# Patient Record
Sex: Male | Born: 2000
Health system: Southern US, Community
[De-identification: ages and names within clinical notes are randomized; demographics above are authoritative.]

## PROBLEM LIST (undated history)

## (undated) DIAGNOSIS — F909 Attention-deficit hyperactivity disorder, unspecified type: Secondary | ICD-10-CM

## (undated) HISTORY — PX: CARDIAC SURGERY: SHX584

## (undated) HISTORY — DX: Attention-deficit hyperactivity disorder, unspecified type: F90.9

---
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
## 2021-05-05 DIAGNOSIS — S6291XA Unspecified fracture of right wrist and hand, initial encounter for closed fracture: Secondary | ICD-10-CM | POA: Diagnosis not present

## 2021-05-05 DIAGNOSIS — M79641 Pain in right hand: Secondary | ICD-10-CM | POA: Diagnosis not present

## 2021-05-06 DIAGNOSIS — S62356D Nondisplaced fracture of shaft of fifth metacarpal bone, right hand, subsequent encounter for fracture with routine healing: Secondary | ICD-10-CM | POA: Diagnosis not present

## 2021-05-26 DIAGNOSIS — S62356D Nondisplaced fracture of shaft of fifth metacarpal bone, right hand, subsequent encounter for fracture with routine healing: Secondary | ICD-10-CM | POA: Diagnosis not present

## 2022-03-07 IMAGING — CT CT ABD-PELV W/ CM
2 of 4 series · 15 of 46 positions shown, 17 images · IV contrast (APPLIED)
Comparison: None.

CLINICAL DATA: Right lower quadrant abdominal pain.

EXAM:
CT ABDOMEN AND PELVIS WITH CONTRAST
TECHNIQUE: Multidetector CT imaging of the abdomen and pelvis was performed
using the standard protocol following bolus administration of
intravenous contrast.
CONTRAST:  100mL OMNIPAQUE IOHEXOL 300 MG/ML  SOLN

[Series 2: routine abd/pel with · axial · 0.71mm/px · z∈[-1007,-607]mm · 12 of 92 slices shown, 14 images]
[im 8/92  soft-tissue]
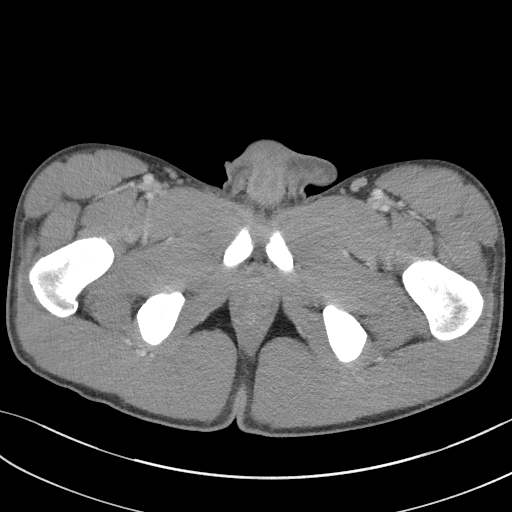
[im 8/92  bone]
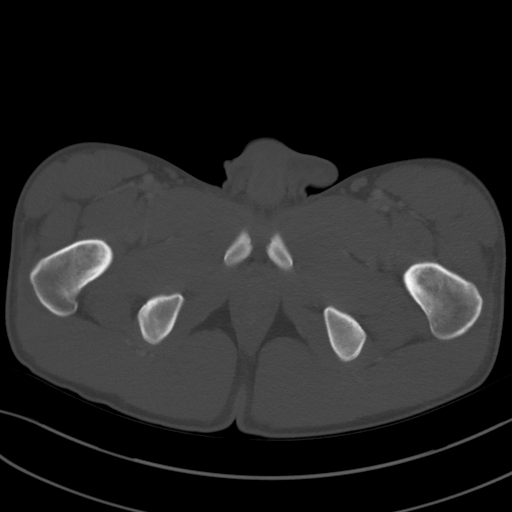
[im 15/92  soft-tissue]
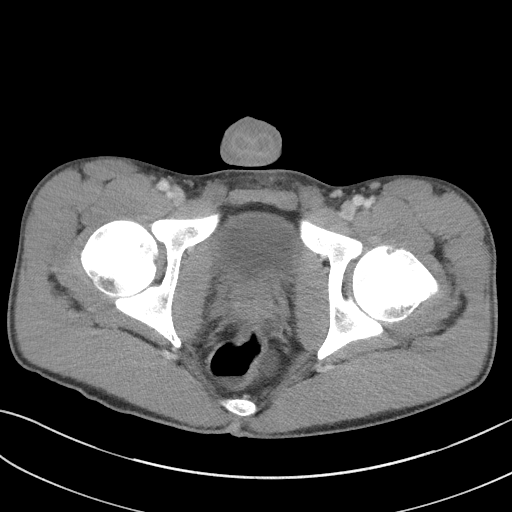
[im 22/92  soft-tissue]
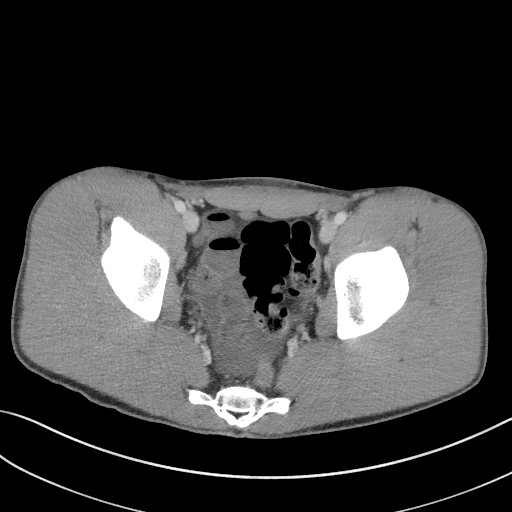
[im 30/92  soft-tissue]
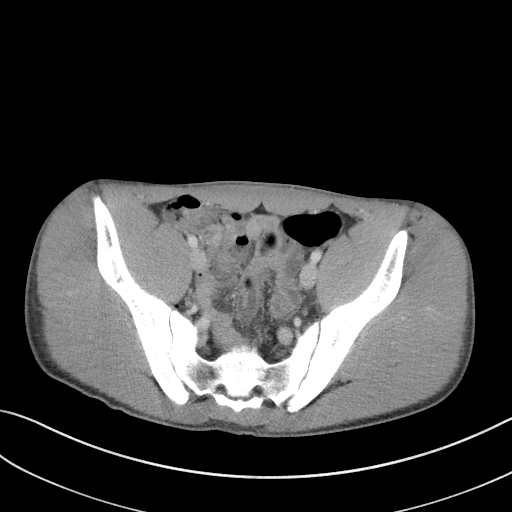
[im 37/92  soft-tissue]
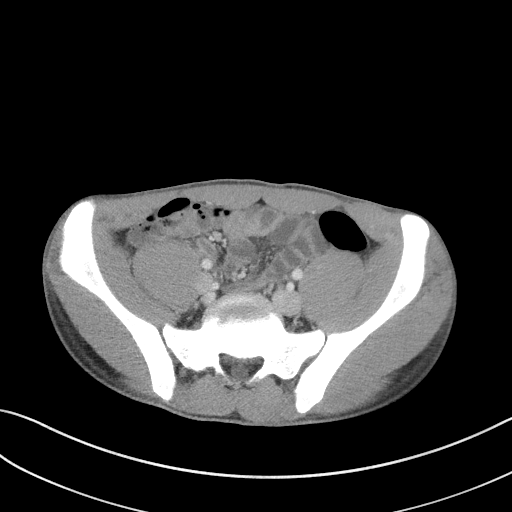
[im 44/92  soft-tissue]
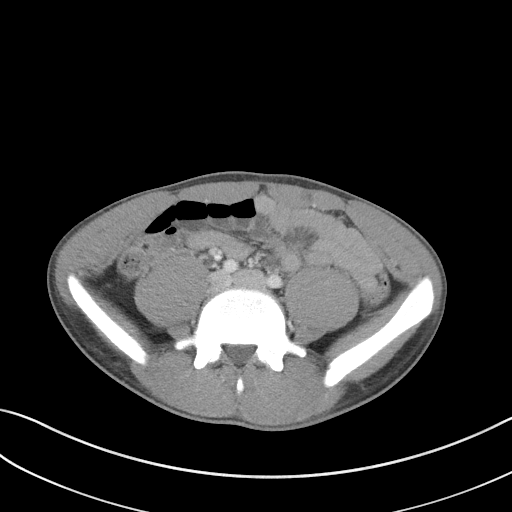
[im 51/92  soft-tissue]
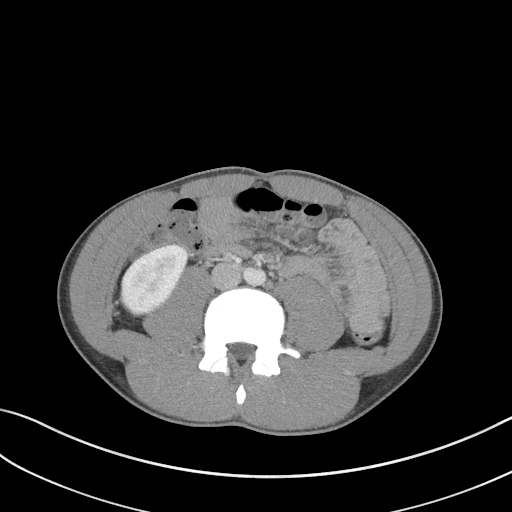
[im 59/92  soft-tissue]
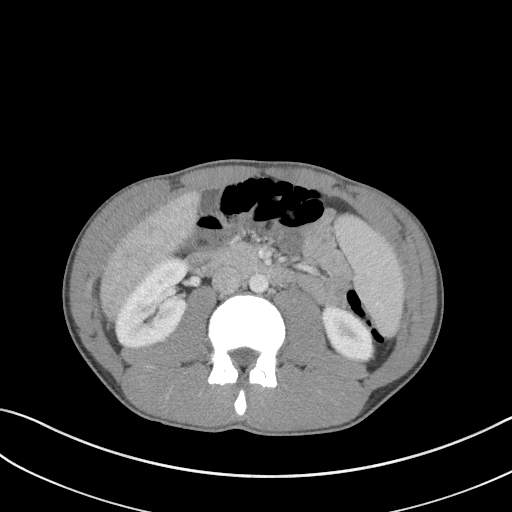
[im 66/92  soft-tissue]
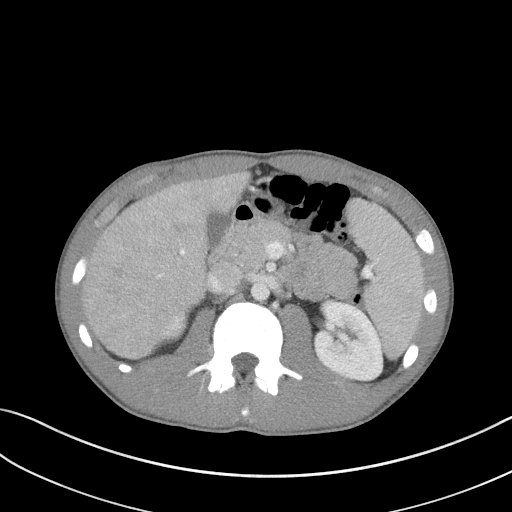
[im 66/92  bone]
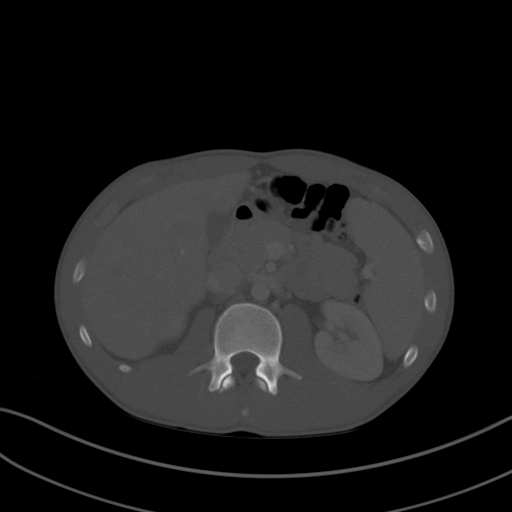
[im 73/92  soft-tissue]
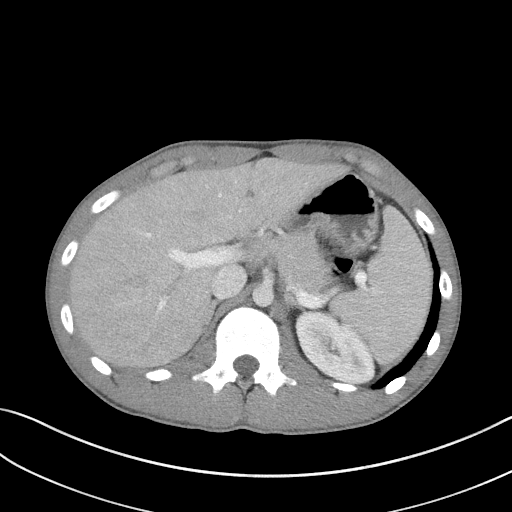
[im 81/92  soft-tissue]
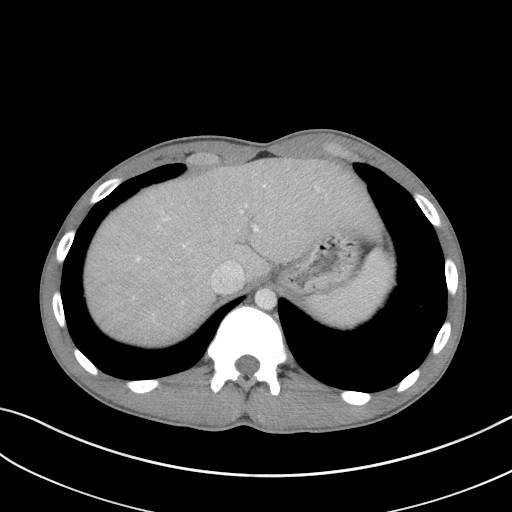
[im 88/92  soft-tissue]
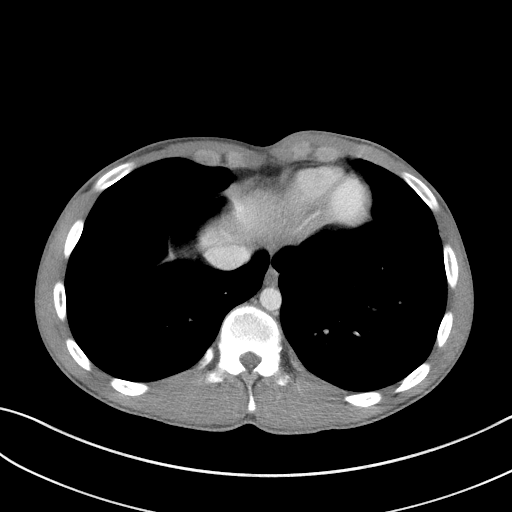

[Series 5: coronal st · coronal · 0.63mm/px · 3 of 71 slices shown]
[im 24/71  soft-tissue]
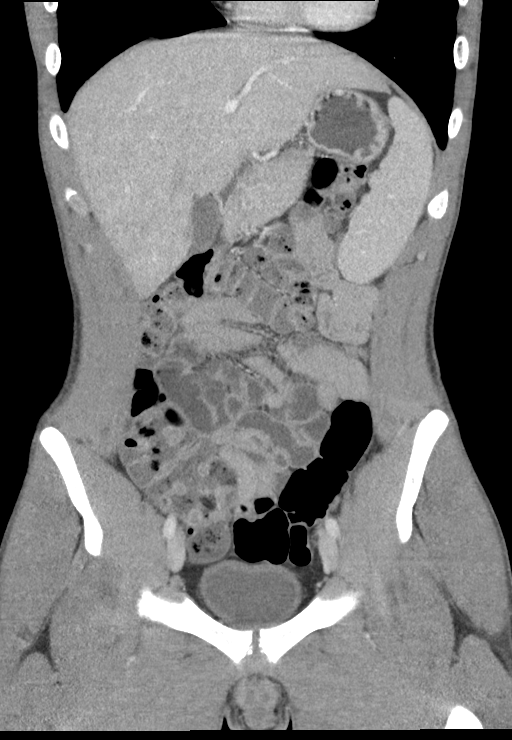
[im 32/71  soft-tissue]
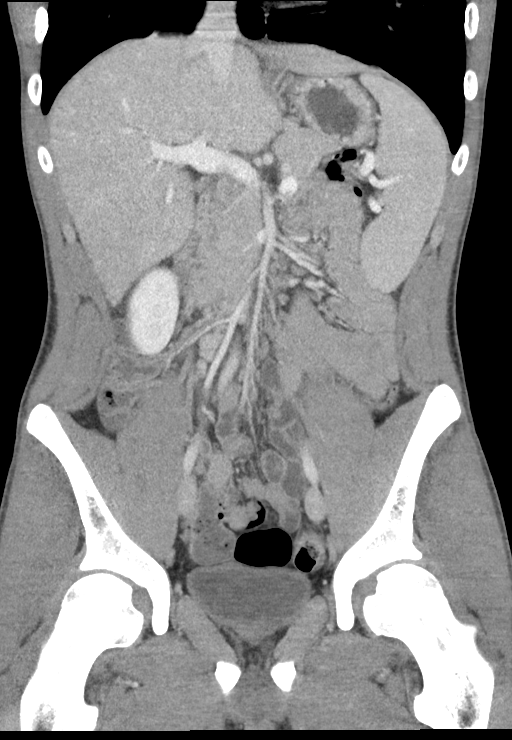
[im 39/71  soft-tissue]
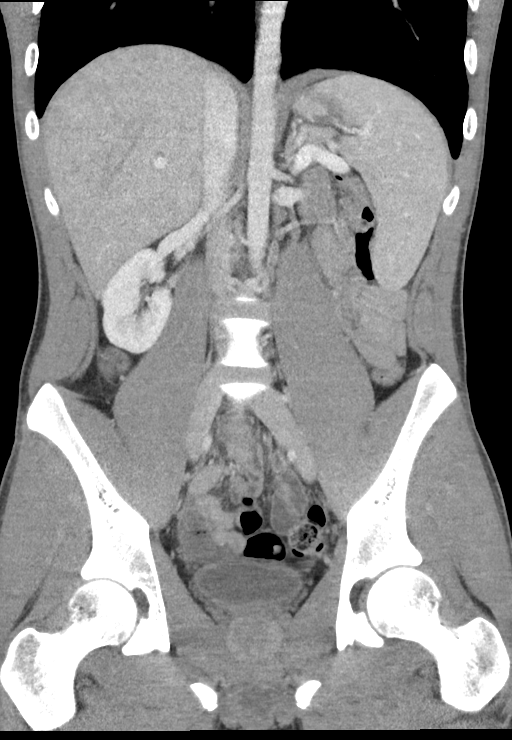

[15 of 46 positions shown; findings below may reference images not displayed]

FINDINGS: Lower chest: The lung bases are clear. The heart size is normal.

Hepatobiliary: The liver is normal. Normal gallbladder.There is no
biliary ductal dilation.

Pancreas: Normal contours without ductal dilatation. No
peripancreatic fluid collection.

Spleen: The spleen is enlarged measuring nearly 14 cm craniocaudad.

Adrenals/Urinary Tract:

--Adrenal glands: Unremarkable.

--Right kidney/ureter: No hydronephrosis or radiopaque kidney
stones.

--Left kidney/ureter: No hydronephrosis or radiopaque kidney stones.

--Urinary bladder: There is mild bladder wall thickening.

Stomach/Bowel:

--Stomach/Duodenum: No hiatal hernia or other gastric abnormality.
Normal duodenal course and caliber.

--Small bowel: Unremarkable.

--Colon: Unremarkable.

--Appendix: The appendix is mildly dilated measuring up to
approximately 8-9 mm in diameter. There is mild hyperenhancement of
the appendix. There is no appendicloith.

Vascular/Lymphatic: Normal course and caliber of the major abdominal
vessels.

--No retroperitoneal lymphadenopathy.

--there is some mild adenopathy in the right lower quadrant,
presumably reactive.

--No pelvic or inguinal lymphadenopathy.

Reproductive: Unremarkable

Other: There is a moderate amount of free fluid in the patient's
pelvis. There is no free air. The abdominal wall is normal.

Musculoskeletal. No acute displaced fractures.
IMPRESSION: 1. Mildly dilated appendix with mild hyperenhancement, concerning
for very early acute uncomplicated appendicitis in the appropriate
clinical setting. Correlation with laboratory studies and physical
exam is recommended.
2. Moderate amount of free fluid in the patient's pelvis.
3. Splenomegaly.
4. Mild bladder wall thickening. Correlate with urinalysis.

## 2022-04-28 ENCOUNTER — Other Ambulatory Visit: Payer: Self-pay

## 2022-04-28 ENCOUNTER — Emergency Department: Payer: BC Managed Care – PPO

## 2022-04-28 ENCOUNTER — Emergency Department
Admission: EM | Admit: 2022-04-28 | Discharge: 2022-04-28 | Disposition: A | Discharge status: Home / Self Care | Payer: BC Managed Care – PPO | Attending: Emergency Medicine | Admitting: Emergency Medicine

## 2022-04-28 DIAGNOSIS — N50812 Left testicular pain: Secondary | ICD-10-CM

## 2022-04-28 DIAGNOSIS — N451 Epididymitis: Secondary | ICD-10-CM | POA: Diagnosis not present

## 2022-04-28 DIAGNOSIS — N5089 Other specified disorders of the male genital organs: Secondary | ICD-10-CM | POA: Insufficient documentation

## 2022-04-28 DIAGNOSIS — N433 Hydrocele, unspecified: Secondary | ICD-10-CM | POA: Diagnosis not present

## 2022-04-28 LAB — URINALYSIS, W/ REFLEX TO CULTURE (INFECTION SUSPECTED)
Bacteria, UA: NONE SEEN
Bilirubin Urine: NEGATIVE
Glucose, UA: NEGATIVE mg/dL
Hgb urine dipstick: NEGATIVE
Ketones, ur: NEGATIVE mg/dL
Leukocytes,Ua: NEGATIVE
Nitrite: NEGATIVE
Protein, ur: NEGATIVE mg/dL
Specific Gravity, Urine: 1.017 (ref 1.005–1.030)
pH: 6 (ref 5.0–8.0)

## 2022-04-28 MED ORDER — IBUPROFEN 600 MG PO TABS
600.0000 mg | ORAL_TABLET | Freq: Once | ORAL | Status: AC
  Administered 2022-04-28: 600 mg via ORAL
  Filled 2022-04-28: qty 1

## 2022-04-28 MED ORDER — DOXYCYCLINE HYCLATE 100 MG PO TABS: 100.0000 mg | ORAL_TABLET | Freq: Two times a day (BID) | ORAL | 0 refills | Status: AC

## 2022-04-28 MED ORDER — DOXYCYCLINE HYCLATE 100 MG PO TABS
100.0000 mg | ORAL_TABLET | Freq: Once | ORAL | Status: AC
  Administered 2022-04-28: 100 mg via ORAL
  Filled 2022-04-28: qty 1

## 2022-04-28 MED ORDER — LIDOCAINE HCL (PF) 1 % IJ SOLN
1.0000 mL | Freq: Once | INTRAMUSCULAR | Status: AC
  Administered 2022-04-28: 1 mL
  Filled 2022-04-28: qty 5

## 2022-04-28 MED ORDER — CEFTRIAXONE SODIUM 1 G IJ SOLR
500.0000 mg | Freq: Once | INTRAMUSCULAR | Status: AC
  Administered 2022-04-28: 500 mg via INTRAMUSCULAR
  Filled 2022-04-28: qty 10

## 2022-04-28 MED ORDER — ACETAMINOPHEN 500 MG PO TABS
1000.0000 mg | ORAL_TABLET | Freq: Once | ORAL | Status: AC
  Administered 2022-04-28: 1000 mg via ORAL
  Filled 2022-04-28: qty 2

## 2022-04-28 NOTE — ED Provider Notes (Signed)
[facility] Provider Note    Event Date/Time   First MD Initiated Contact with Patient 04/28/22 1106     (approximate)   History   Testicle Pain   HPI  Levell Tavano is a 22 y.o. male   Past medical history of young man who presents with left testicular pain upon waking this morning.  Atraumatic.  No dysuria or penile discharge.  No other acute medical complaints.  Normal state of health yesterday.  No abdominal pain, nausea or vomiting.    Independent Historian contributed to assessment above: His mother is at bedside to corroborate with story and past medical history.     Physical Exam   Triage Vital Signs: ED Triage Vitals  Enc Vitals Group     BP 04/28/22 1025 125/74     Pulse Rate 04/28/22 1025 68     Resp 04/28/22 1024 16     Temp 04/28/22 1024 98.2 F (36.8 C)     Temp src --      SpO2 04/28/22 1025 100 %     Weight 04/28/22 1024 160 lb (72.6 kg)     Height 04/28/22 1024  (1.854 m)     Head Circumference --      Peak Flow --      Pain Score 04/28/22 1024 8     Pain Loc --      Pain Edu? --      Excl. in GC? --     Most recent vital signs: Vitals:   04/28/22 1024 04/28/22 1025  BP:  125/74  Pulse:  68  Resp: 16   Temp: 98.2 F (36.8 C)   SpO2:  100%    General: Awake, no distress.  CV:  Good peripheral perfusion.  Resp:  Normal effort.  Abd:  No distention.  Other:  Mildly swollen and mildly tender left testicle.  No overlying skin changes.  No masses in the inguinal lower abdominal area and abdomen is soft and nontender.   ED Results / Procedures / Treatments   Labs (all labs ordered are listed, but only abnormal results are displayed) Labs Reviewed  URINALYSIS, W/ REFLEX TO CULTURE (INFECTION SUSPECTED) - Abnormal; Notable for the following components:      Result Value   Color, Urine YELLOW (*)    APPearance CLEAR (*)    All other components within normal limits     I ordered and reviewed the  above labs they are notable for urinalysis without inflammatory changes or bacteria    RADIOLOGY I independently reviewed and interpreted testicle ultrasound to see no obvious masses   PROCEDURES:  Critical Care performed: No  Procedures   MEDICATIONS ORDERED IN ED: Medications  acetaminophen (TYLENOL) tablet 1,000 mg (1,000 mg Oral Given 04/28/22 1212)  ibuprofen (ADVIL) tablet 600 mg (600 mg Oral Given 04/28/22 1212)  cefTRIAXone (ROCEPHIN) injection 500 mg (500 mg Intramuscular Given 04/28/22 1334)  lidocaine (PF) (XYLOCAINE) 1 % injection 1-2.1 mL (1 mL Other Given 04/28/22 1334)  doxycycline (VIBRA-TABS) tablet 100 mg (100 mg Oral Given 04/28/22 1333)     IMPRESSION / MDM / ASSESSMENT AND PLAN / ED COURSE  I reviewed the triage vital signs and the nursing notes.                                Patient's presentation is most consistent with acute presentation with potential threat to life or  bodily function.  Differential diagnosis includes, but is not limited to, ocular torsion, orchitis, epididymitis, testicular injury, testicular rupture, STI, urinary tract infection, varicocele, hydrocele   MDM:   Scrotal ultrasound ordered from triage to evaluate for torsion or infection.  Urinalysis to assess for urinary tract infection as well as chlamydia/gonorrhea testing.  No high risk exposures and no obvious STI symptoms so will defer empiric treatment until this test comes back.   -- Epididymitis on ultrasound.  STI testing pending.  Will treat with ceftriaxone and doxycycline.  He will follow-up with PMD.  Referral made for PMD.        FINAL CLINICAL IMPRESSION(S) / ED DIAGNOSES   Final diagnoses:  Pain in left testicle  Epididymitis  Testicular microlithiasis     Rx / DC Orders   ED Discharge Orders          Ordered    doxycycline (VIBRA-TABS) 100 MG tablet  2 times daily        04/28/22 1327    Ambulatory Referral to Primary Care (Establish Care)         04/28/22 1338             Note:  This document was prepared using Dragon voice recognition software and may include unintentional dictation errors.    Pilar Jarvis, MD 04/28/22 [phone]

## 2022-04-28 NOTE — ED Triage Notes (Signed)
Pt to ED for left sided testicle pain, also reports swelling to left testicle started this am.

## 2022-04-28 NOTE — Discharge Instructions (Addendum)
Take antibiotics for the full course as prescribed for epididymitis.  Follow-up with your primary doctor and check the results of your gonorrhea/chlamydia testing.  You have microlithiasis found on the right side testicle.  These are likely benign.  Talk to your doctor about rechecking at the proper time interval as needed.  Take acetaminophen 650 mg and ibuprofen 400 mg every 6 hours for pain.  Take with food.

## 2022-10-31 DIAGNOSIS — S9032XA Contusion of left foot, initial encounter: Secondary | ICD-10-CM | POA: Diagnosis not present

## 2022-12-01 DIAGNOSIS — R001 Bradycardia, unspecified: Secondary | ICD-10-CM | POA: Diagnosis not present

## 2022-12-01 DIAGNOSIS — Q2111 Secundum atrial septal defect: Secondary | ICD-10-CM | POA: Diagnosis not present

## 2022-12-01 DIAGNOSIS — I4510 Unspecified right bundle-branch block: Secondary | ICD-10-CM | POA: Diagnosis not present

## 2022-12-01 DIAGNOSIS — Z8774 Personal history of (corrected) congenital malformations of heart and circulatory system: Secondary | ICD-10-CM | POA: Diagnosis not present

## 2022-12-01 DIAGNOSIS — I370 Nonrheumatic pulmonary valve stenosis: Secondary | ICD-10-CM | POA: Diagnosis not present

## 2022-12-01 DIAGNOSIS — I088 Other rheumatic multiple valve diseases: Secondary | ICD-10-CM | POA: Diagnosis not present

## 2022-12-01 DIAGNOSIS — Q2382 Congenital mitral valve cleft leaflet: Secondary | ICD-10-CM | POA: Diagnosis not present

## 2022-12-01 DIAGNOSIS — R9431 Abnormal electrocardiogram [ECG] [EKG]: Secondary | ICD-10-CM | POA: Diagnosis not present

## 2022-12-01 DIAGNOSIS — Q212 Atrioventricular septal defect, unspecified as to partial or complete: Secondary | ICD-10-CM | POA: Diagnosis not present
